# Patient Record
Sex: Female | Born: 1945 | Race: White | Hispanic: No | Marital: Married | Smoking: Former smoker
Health system: Southern US, Community
[De-identification: ages and names within clinical notes are randomized; demographics above are authoritative.]

## PROBLEM LIST (undated history)

## (undated) DIAGNOSIS — C801 Malignant (primary) neoplasm, unspecified: Secondary | ICD-10-CM

## (undated) HISTORY — PX: TONSILLECTOMY: SUR1361

## (undated) HISTORY — PX: BUNIONECTOMY: SHX129

## (undated) HISTORY — PX: APPENDECTOMY: SHX54

## (undated) HISTORY — PX: MASTECTOMY: SHX3

## (undated) HISTORY — PX: ABDOMINAL HYSTERECTOMY: SHX81

---

## 2020-04-11 ENCOUNTER — Encounter (HOSPITAL_COMMUNITY): Payer: Self-pay | Admitting: Emergency Medicine

## 2020-04-11 ENCOUNTER — Inpatient Hospital Stay (HOSPITAL_COMMUNITY): Payer: No Typology Code available for payment source

## 2020-04-11 ENCOUNTER — Emergency Department (HOSPITAL_COMMUNITY): Payer: No Typology Code available for payment source

## 2020-04-11 ENCOUNTER — Other Ambulatory Visit: Payer: Self-pay

## 2020-04-11 ENCOUNTER — Inpatient Hospital Stay (HOSPITAL_COMMUNITY)
Admission: EM | Admit: 2020-04-11 | Discharge: 2020-04-14 | DRG: 565 | Disposition: A | Discharge status: Home / Self Care | Payer: No Typology Code available for payment source | Attending: General Surgery | Admitting: General Surgery

## 2020-04-11 DIAGNOSIS — D62 Acute posthemorrhagic anemia: Secondary | ICD-10-CM | POA: Diagnosis present

## 2020-04-11 DIAGNOSIS — S22040A Wedge compression fracture of fourth thoracic vertebra, initial encounter for closed fracture: Secondary | ICD-10-CM

## 2020-04-11 DIAGNOSIS — R911 Solitary pulmonary nodule: Secondary | ICD-10-CM | POA: Diagnosis present

## 2020-04-11 DIAGNOSIS — M419 Scoliosis, unspecified: Secondary | ICD-10-CM | POA: Diagnosis present

## 2020-04-11 DIAGNOSIS — R413 Other amnesia: Secondary | ICD-10-CM | POA: Diagnosis present

## 2020-04-11 DIAGNOSIS — Z881 Allergy status to other antibiotic agents status: Secondary | ICD-10-CM

## 2020-04-11 DIAGNOSIS — R110 Nausea: Secondary | ICD-10-CM | POA: Diagnosis present

## 2020-04-11 DIAGNOSIS — S22050A Wedge compression fracture of T5-T6 vertebra, initial encounter for closed fracture: Secondary | ICD-10-CM

## 2020-04-11 DIAGNOSIS — S2222XA Fracture of body of sternum, initial encounter for closed fracture: Secondary | ICD-10-CM | POA: Diagnosis not present

## 2020-04-11 DIAGNOSIS — Z20822 Contact with and (suspected) exposure to covid-19: Secondary | ICD-10-CM | POA: Diagnosis present

## 2020-04-11 DIAGNOSIS — S2220XA Unspecified fracture of sternum, initial encounter for closed fracture: Secondary | ICD-10-CM | POA: Diagnosis present

## 2020-04-11 DIAGNOSIS — M4854XA Collapsed vertebra, not elsewhere classified, thoracic region, initial encounter for fracture: Secondary | ICD-10-CM | POA: Diagnosis present

## 2020-04-11 DIAGNOSIS — I471 Supraventricular tachycardia: Secondary | ICD-10-CM | POA: Diagnosis not present

## 2020-04-11 DIAGNOSIS — Z888 Allergy status to other drugs, medicaments and biological substances status: Secondary | ICD-10-CM | POA: Diagnosis not present

## 2020-04-11 DIAGNOSIS — I700 Atherosclerosis of aorta: Secondary | ICD-10-CM | POA: Diagnosis present

## 2020-04-11 DIAGNOSIS — R1010 Upper abdominal pain, unspecified: Secondary | ICD-10-CM | POA: Diagnosis not present

## 2020-04-11 DIAGNOSIS — Z79899 Other long term (current) drug therapy: Secondary | ICD-10-CM

## 2020-04-11 DIAGNOSIS — Z885 Allergy status to narcotic agent status: Secondary | ICD-10-CM | POA: Diagnosis not present

## 2020-04-11 DIAGNOSIS — Z9882 Breast implant status: Secondary | ICD-10-CM

## 2020-04-11 DIAGNOSIS — Y92410 Unspecified street and highway as the place of occurrence of the external cause: Secondary | ICD-10-CM

## 2020-04-11 DIAGNOSIS — R140 Abdominal distension (gaseous): Secondary | ICD-10-CM

## 2020-04-11 DIAGNOSIS — R0902 Hypoxemia: Secondary | ICD-10-CM | POA: Diagnosis present

## 2020-04-11 DIAGNOSIS — Z853 Personal history of malignant neoplasm of breast: Secondary | ICD-10-CM | POA: Diagnosis not present

## 2020-04-11 DIAGNOSIS — R0602 Shortness of breath: Secondary | ICD-10-CM | POA: Diagnosis not present

## 2020-04-11 DIAGNOSIS — I252 Old myocardial infarction: Secondary | ICD-10-CM | POA: Diagnosis not present

## 2020-04-11 DIAGNOSIS — Z9013 Acquired absence of bilateral breasts and nipples: Secondary | ICD-10-CM | POA: Diagnosis not present

## 2020-04-11 DIAGNOSIS — S2241XA Multiple fractures of ribs, right side, initial encounter for closed fracture: Secondary | ICD-10-CM | POA: Diagnosis present

## 2020-04-11 DIAGNOSIS — R079 Chest pain, unspecified: Secondary | ICD-10-CM | POA: Diagnosis not present

## 2020-04-11 HISTORY — DX: Malignant (primary) neoplasm, unspecified: C80.1

## 2020-04-11 LAB — CBC
HCT: 41.8 % (ref 36.0–46.0)
Hemoglobin: 13.3 g/dL (ref 12.0–15.0)
MCH: 30.4 pg (ref 26.0–34.0)
MCHC: 31.8 g/dL (ref 30.0–36.0)
MCV: 95.4 fL (ref 80.0–100.0)
Platelets: 170 10*3/uL (ref 150–400)
RBC: 4.38 MIL/uL (ref 3.87–5.11)
RDW: 13.4 % (ref 11.5–15.5)
WBC: 12.4 10*3/uL — ABNORMAL HIGH (ref 4.0–10.5)
nRBC: 0 % (ref 0.0–0.2)

## 2020-04-11 LAB — TROPONIN I (HIGH SENSITIVITY)
Troponin I (High Sensitivity): 28 ng/L — ABNORMAL HIGH (ref <18)
Troponin I (High Sensitivity): 100 ng/L (ref <18)

## 2020-04-11 LAB — I-STAT CHEM 8, ED
BUN: 25 mg/dL — ABNORMAL HIGH (ref 8–23)
Calcium, Ion: 1.14 mmol/L — ABNORMAL LOW (ref 1.15–1.40)
Chloride: 105 mmol/L (ref 98–111)
Creatinine, Ser: 0.7 mg/dL (ref 0.44–1.00)
Glucose, Bld: 107 mg/dL — ABNORMAL HIGH (ref 70–99)
HCT: 42 % (ref 36.0–46.0)
Hemoglobin: 14.3 g/dL (ref 12.0–15.0)
Potassium: 4.4 mmol/L (ref 3.5–5.1)
Sodium: 137 mmol/L (ref 135–145)
TCO2: 22 mmol/L (ref 22–32)

## 2020-04-11 LAB — BASIC METABOLIC PANEL
Anion gap: 10 (ref 5–15)
BUN: 22 mg/dL (ref 8–23)
CO2: 22 mmol/L (ref 22–32)
Calcium: 9.3 mg/dL (ref 8.9–10.3)
Chloride: 104 mmol/L (ref 98–111)
Creatinine, Ser: 0.78 mg/dL (ref 0.44–1.00)
GFR, Estimated: >60 mL/min (ref >60)
Glucose, Bld: 109 mg/dL — ABNORMAL HIGH (ref 70–99)
Potassium: 4.4 mmol/L (ref 3.5–5.1)
Sodium: 136 mmol/L (ref 135–145)

## 2020-04-11 LAB — RESPIRATORY PANEL BY RT PCR (FLU A&B, COVID)
Influenza A by PCR: NEGATIVE
Influenza B by PCR: NEGATIVE
SARS Coronavirus 2 by RT PCR: NEGATIVE

## 2020-04-11 MED ORDER — ONDANSETRON 4 MG PO TBDP
4.0000 mg | ORAL_TABLET | Freq: Four times a day (QID) | ORAL | Status: DC | PRN
  Administered 2020-04-14: 4 mg via ORAL
  Filled 2020-04-11: qty 1

## 2020-04-11 MED ORDER — OXYCODONE HCL 5 MG PO TABS
2.5000 mg | ORAL_TABLET | ORAL | Status: DC | PRN
  Administered 2020-04-11 – 2020-04-13 (×6): 5 mg via ORAL
  Filled 2020-04-11 (×7): qty 1

## 2020-04-11 MED ORDER — ACETAMINOPHEN 500 MG PO TABS
1000.0000 mg | ORAL_TABLET | Freq: Three times a day (TID) | ORAL | Status: DC
  Administered 2020-04-11: 1000 mg via ORAL
  Filled 2020-04-11: qty 2

## 2020-04-11 MED ORDER — ONDANSETRON HCL 4 MG/2ML IJ SOLN
4.0000 mg | Freq: Four times a day (QID) | INTRAMUSCULAR | Status: DC | PRN
  Administered 2020-04-12 – 2020-04-13 (×2): 4 mg via INTRAVENOUS
  Filled 2020-04-11 (×2): qty 2

## 2020-04-11 MED ORDER — DOCUSATE SODIUM 100 MG PO CAPS
100.0000 mg | ORAL_CAPSULE | Freq: Two times a day (BID) | ORAL | Status: DC
  Administered 2020-04-11 – 2020-04-13 (×5): 100 mg via ORAL
  Filled 2020-04-11 (×6): qty 1

## 2020-04-11 MED ORDER — ONDANSETRON HCL 4 MG/2ML IJ SOLN
4.0000 mg | Freq: Once | INTRAMUSCULAR | Status: AC
  Administered 2020-04-11: 4 mg via INTRAVENOUS
  Filled 2020-04-11: qty 2

## 2020-04-11 MED ORDER — METHOCARBAMOL 500 MG PO TABS
1000.0000 mg | ORAL_TABLET | Freq: Three times a day (TID) | ORAL | Status: DC
  Administered 2020-04-11 – 2020-04-14 (×9): 1000 mg via ORAL
  Filled 2020-04-11 (×9): qty 2

## 2020-04-11 MED ORDER — ACETAMINOPHEN 500 MG PO TABS
1000.0000 mg | ORAL_TABLET | Freq: Four times a day (QID) | ORAL | Status: DC
  Administered 2020-04-11 – 2020-04-14 (×10): 1000 mg via ORAL
  Filled 2020-04-11 (×10): qty 2

## 2020-04-11 MED ORDER — SODIUM CHLORIDE 0.9 % IV BOLUS
500.0000 mL | Freq: Once | INTRAVENOUS | Status: AC
  Administered 2020-04-11: 500 mL via INTRAVENOUS

## 2020-04-11 MED ORDER — SODIUM CHLORIDE 0.9 % IV SOLN: INTRAVENOUS | Status: DC

## 2020-04-11 MED ORDER — IOHEXOL 350 MG/ML SOLN
100.0000 mL | Freq: Once | INTRAVENOUS | Status: AC | PRN
  Administered 2020-04-11: 100 mL via INTRAVENOUS

## 2020-04-11 MED ORDER — HYDROMORPHONE HCL 1 MG/ML IJ SOLN
0.2500 mg | INTRAMUSCULAR | Status: DC | PRN
  Administered 2020-04-11 – 2020-04-13 (×6): 0.25 mg via INTRAVENOUS
  Filled 2020-04-11 (×5): qty 0.5
  Filled 2020-04-11: qty 1
  Filled 2020-04-11: qty 0.5

## 2020-04-11 MED ORDER — HYDROMORPHONE HCL 1 MG/ML IJ SOLN
1.0000 mg | Freq: Once | INTRAMUSCULAR | Status: AC
  Administered 2020-04-11: 1 mg via INTRAVENOUS
  Filled 2020-04-11: qty 1

## 2020-04-11 MED ORDER — LIDOCAINE 5 % EX PTCH
1.0000 | MEDICATED_PATCH | CUTANEOUS | Status: DC
  Administered 2020-04-11 – 2020-04-13 (×3): 1 via TRANSDERMAL
  Filled 2020-04-11 (×3): qty 1

## 2020-04-11 MED ORDER — METOPROLOL TARTRATE 5 MG/5ML IV SOLN: 5.0000 mg | Freq: Four times a day (QID) | INTRAVENOUS | Status: DC | PRN

## 2020-04-11 MED ORDER — HYDROMORPHONE HCL 1 MG/ML IJ SOLN: 0.5000 mg | INTRAMUSCULAR | Status: DC | PRN

## 2020-04-11 MED ORDER — METHOCARBAMOL 500 MG PO TABS: 500.0000 mg | ORAL_TABLET | Freq: Four times a day (QID) | ORAL | Status: DC | PRN
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
## 2020-04-11 NOTE — ED Notes (Signed)
This RN called 5N to recall in 10 minutes.

## 2020-04-11 NOTE — ED Notes (Signed)
Attempted report x1. 
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
## 2020-04-12 ENCOUNTER — Inpatient Hospital Stay (HOSPITAL_COMMUNITY): Payer: No Typology Code available for payment source

## 2020-04-12 DIAGNOSIS — R079 Chest pain, unspecified: Secondary | ICD-10-CM | POA: Diagnosis not present

## 2020-04-12 DIAGNOSIS — R0602 Shortness of breath: Secondary | ICD-10-CM

## 2020-04-12 LAB — ECHOCARDIOGRAM COMPLETE
Area-P 1/2: 5.13 cm2
Height: 64 in
S' Lateral: 1.83 cm
Weight: 1996.49 oz

## 2020-04-12 LAB — TROPONIN I (HIGH SENSITIVITY)
Troponin I (High Sensitivity): 148 ng/L (ref <18)
Troponin I (High Sensitivity): 87 ng/L — ABNORMAL HIGH (ref <18)
Troponin I (High Sensitivity): 80 ng/L — ABNORMAL HIGH (ref <18)

## 2020-04-12 LAB — CBC
HCT: 34.1 % — ABNORMAL LOW (ref 36.0–46.0)
Hemoglobin: 10.7 g/dL — ABNORMAL LOW (ref 12.0–15.0)
MCH: 30.3 pg (ref 26.0–34.0)
MCHC: 31.4 g/dL (ref 30.0–36.0)
MCV: 96.6 fL (ref 80.0–100.0)
Platelets: 151 10*3/uL (ref 150–400)
RBC: 3.53 MIL/uL — ABNORMAL LOW (ref 3.87–5.11)
RDW: 13.6 % (ref 11.5–15.5)
WBC: 7.3 10*3/uL (ref 4.0–10.5)
nRBC: 0 % (ref 0.0–0.2)

## 2020-04-12 LAB — BASIC METABOLIC PANEL
Anion gap: 5 (ref 5–15)
BUN: 15 mg/dL (ref 8–23)
CO2: 25 mmol/L (ref 22–32)
Calcium: 8.2 mg/dL — ABNORMAL LOW (ref 8.9–10.3)
Chloride: 107 mmol/L (ref 98–111)
Creatinine, Ser: 0.72 mg/dL (ref 0.44–1.00)
GFR, Estimated: >60 mL/min (ref >60)
Glucose, Bld: 93 mg/dL (ref 70–99)
Potassium: 4.2 mmol/L (ref 3.5–5.1)
Sodium: 137 mmol/L (ref 135–145)

## 2020-04-12 MED ORDER — ENOXAPARIN SODIUM 30 MG/0.3ML ~~LOC~~ SOLN
30.0000 mg | Freq: Two times a day (BID) | SUBCUTANEOUS | Status: DC
  Administered 2020-04-12 – 2020-04-14 (×5): 30 mg via SUBCUTANEOUS
  Filled 2020-04-12 (×5): qty 0.3

## 2020-04-12 MED ORDER — POLYETHYLENE GLYCOL 3350 17 G PO PACK
17.0000 g | PACK | Freq: Every day | ORAL | Status: DC
  Administered 2020-04-12: 17 g via ORAL
  Filled 2020-04-12: qty 1
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
## 2020-04-12 NOTE — Progress Notes (Addendum)
  Echocardiogram 2D Echocardiogram has been attempted. Patient with PT. Will reattempt at later time.  [name] G Deshia Vanderhoof 04/12/2020, 10:00 AM
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
## 2020-04-12 NOTE — Progress Notes (Signed)
Echocardiogram 2D Echocardiogram has been performed.  Rebekah Francis Rebekah Francis 04/12/2020, 2:56 PM

## 2020-04-12 NOTE — Progress Notes (Signed)
OT Cancellation Note  Patient Details Name: Rebekah Francis MRN: [id number] DOB: [birth year]   Cancelled Treatment:    Reason Eval/Treat Not Completed: Pain limiting ability to participate. Pt reporting 8/10 pain R anterior chest wall. Oral pain med received a few minutes ago. Plan to reattempt in a bit.   Tyrone Schimke, OT Acute Rehabilitation Services Pager: [phone] Office: [phone] 04/12/2020, 9:21 AM

## 2020-04-12 NOTE — Progress Notes (Signed)
Notified radiology to see if transport was on their way to get the patient for XR. He said that they are a little backed up right now and that the transport request was placed 40 mins ago but that she should be coming up next on the list. Made patient and daughter Roselyn Reef aware.

## 2020-04-12 NOTE — TOC CAGE-AID Note (Signed)
Transition of Care [facility]) - CAGE-AID Screening   Patient Details  Name: Rebekah Francis MRN: [id number] Date of Birth: 1945-07-31  Transition of Care [facility] CM/SW Contact:    Emeterio Reeve, [place] Phone Number: 04/12/2020, 1:45 PM   Clinical Narrative:  CSW met with pt at bedside. CSW introduced self and explained her role at the hospital.  Pt reports occasional alcohol use of less than monthly. Pt denies substance use. Pt did not need resources at this time.   CAGE-AID Screening:    Have You Ever Felt You Ought to Cut Down on Your Drinking or Drug Use?: No Have People Annoyed You By Critizing Your Drinking Or Drug Use?: No Have You Felt Bad Or Guilty About Your Drinking Or Drug Use?: No Have You Ever Had a Drink or Used Drugs First Thing In The Morning to Steady Your Nerves or to Get Rid of a Hangover?: No CAGE-AID Score: 0  Substance Abuse Education Offered: Yes    Blima Ledger, [place] Social Worker [phone]

## 2020-04-12 NOTE — Progress Notes (Signed)
Patient c/o pain to breast/chest area, concerned about breast implants, contacted provider.

## 2020-04-12 NOTE — Progress Notes (Signed)
2140-2210:   Patient got abdominal xr done.
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
## 2020-04-13 LAB — CBC
HCT: 35.5 % — ABNORMAL LOW (ref 36.0–46.0)
Hemoglobin: 11.3 g/dL — ABNORMAL LOW (ref 12.0–15.0)
MCH: 31 pg (ref 26.0–34.0)
MCHC: 31.8 g/dL (ref 30.0–36.0)
MCV: 97.5 fL (ref 80.0–100.0)
Platelets: 147 10*3/uL — ABNORMAL LOW (ref 150–400)
RBC: 3.64 MIL/uL — ABNORMAL LOW (ref 3.87–5.11)
RDW: 13.6 % (ref 11.5–15.5)
WBC: 8.8 10*3/uL (ref 4.0–10.5)
nRBC: 0 % (ref 0.0–0.2)

## 2020-04-13 LAB — BASIC METABOLIC PANEL
Anion gap: 6 (ref 5–15)
BUN: 12 mg/dL (ref 8–23)
CO2: 24 mmol/L (ref 22–32)
Calcium: 8.1 mg/dL — ABNORMAL LOW (ref 8.9–10.3)
Chloride: 106 mmol/L (ref 98–111)
Creatinine, Ser: 0.56 mg/dL (ref 0.44–1.00)
GFR, Estimated: >60 mL/min (ref >60)
Glucose, Bld: 103 mg/dL — ABNORMAL HIGH (ref 70–99)
Potassium: 4.2 mmol/L (ref 3.5–5.1)
Sodium: 136 mmol/L (ref 135–145)

## 2020-04-13 MED ORDER — POLYETHYLENE GLYCOL 3350 17 G PO PACK
17.0000 g | PACK | Freq: Two times a day (BID) | ORAL | Status: DC
  Administered 2020-04-13 (×2): 17 g via ORAL
  Filled 2020-04-13 (×3): qty 1

## 2020-04-13 MED ORDER — PANTOPRAZOLE SODIUM 40 MG PO TBEC
40.0000 mg | DELAYED_RELEASE_TABLET | Freq: Every day | ORAL | Status: DC
  Administered 2020-04-13 – 2020-04-14 (×2): 40 mg via ORAL
  Filled 2020-04-13 (×2): qty 1

## 2020-04-13 MED ORDER — SIMETHICONE 80 MG PO CHEW: 80.0000 mg | CHEWABLE_TABLET | Freq: Four times a day (QID) | ORAL | Status: DC | PRN

## 2020-04-13 MED ORDER — FENTANYL CITRATE (PF) 100 MCG/2ML IJ SOLN
25.0000 ug | Freq: Once | INTRAMUSCULAR | Status: AC
  Administered 2020-04-13: 25 ug via INTRAVENOUS
  Filled 2020-04-13: qty 2

## 2020-04-13 MED ORDER — OXYCODONE HCL 5 MG PO TABS: 5.0000 mg | ORAL_TABLET | ORAL | Status: DC | PRN

## 2020-04-13 MED ORDER — BISACODYL 10 MG RE SUPP
10.0000 mg | Freq: Once | RECTAL | Status: AC
  Administered 2020-04-13: 10 mg via RECTAL
  Filled 2020-04-13: qty 1
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
## 2020-04-13 NOTE — Plan of Care (Signed)
  Problem: Education: Goal: Knowledge of General Education information will improve Description Including pain rating scale, medication(s)/side effects and non-pharmacologic comfort measures Outcome: Progressing   

## 2020-04-13 NOTE — Progress Notes (Signed)
Patient says  pain has been reduced to 6/10 after Fentanyl. Daughter at bedside, bed in lowest position and call bell within reach.
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
## 2020-04-13 NOTE — Progress Notes (Signed)
Notified trauma resident about pt's complaints about RLQ pain. Pt states her pain is 10/10 in RLQ, but does not want to take narcotics due to constipation. Pt tender during palpation. Pt's family member at bedside states her mother has had this localized pain for 36 hours. Agricultural consultant and AD notified, as well as trauma resident. New orders for pain management. Will continue to monitor.

## 2020-04-13 NOTE — Plan of Care (Signed)
Patient is having some new abdominal pain/hardness to her right lower abdomen. XR was done and it was negative. Patient says that the hardness has decreased but she still feels pain there. Has not had a BM since 11/17 prior to admission and it was a small one. Hoping to have a BM today. Pain being managed with prn and scheduled meds as ordered. Daughter at bedside and aware of POC. Will continue to monitor and continue current POC.
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
## 2020-04-14 MED ORDER — METHOCARBAMOL 500 MG PO TABS: 1000.0000 mg | ORAL_TABLET | Freq: Three times a day (TID) | ORAL | 1 refills | Status: AC

## 2020-04-14 MED ORDER — TRAMADOL HCL 50 MG PO TABS
50.0000 mg | ORAL_TABLET | Freq: Four times a day (QID) | ORAL | 0 refills | Status: AC | PRN
Start: 2020-04-14 — End: 2021-04-14

## 2020-04-14 NOTE — Progress Notes (Signed)
Pt had a good bowel movement early this morning. Feeling a lot better. Discharge instructions given to pt, discharged to home accompanied by daughter.
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
